# Patient Record
Sex: Female | Born: 1960 | Race: White | Hispanic: No | Marital: Married | Smoking: Never smoker
Health system: Southern US, Community
[De-identification: ages and names within clinical notes are randomized; demographics above are authoritative.]

## PROBLEM LIST (undated history)

## (undated) DIAGNOSIS — F988 Other specified behavioral and emotional disorders with onset usually occurring in childhood and adolescence: Secondary | ICD-10-CM

## (undated) DIAGNOSIS — M810 Age-related osteoporosis without current pathological fracture: Secondary | ICD-10-CM

## (undated) DIAGNOSIS — M419 Scoliosis, unspecified: Secondary | ICD-10-CM

## (undated) DIAGNOSIS — K219 Gastro-esophageal reflux disease without esophagitis: Secondary | ICD-10-CM

## (undated) DIAGNOSIS — I10 Essential (primary) hypertension: Secondary | ICD-10-CM

## (undated) HISTORY — DX: Gastro-esophageal reflux disease without esophagitis: K21.9

## (undated) HISTORY — PX: BUNIONECTOMY: SHX129

## (undated) HISTORY — DX: Other specified behavioral and emotional disorders with onset usually occurring in childhood and adolescence: F98.8

## (undated) HISTORY — DX: Age-related osteoporosis without current pathological fracture: M81.0

## (undated) HISTORY — DX: Scoliosis, unspecified: M41.9

## (undated) HISTORY — PX: BACK SURGERY: SHX140

## (undated) HISTORY — PX: SHOULDER SURGERY: SHX246

## (undated) HISTORY — PX: OTHER SURGICAL HISTORY: SHX169

## (undated) HISTORY — DX: Essential (primary) hypertension: I10

---

## 2004-11-21 ENCOUNTER — Ambulatory Visit (HOSPITAL_COMMUNITY): Admission: RE | Admit: 2004-11-21 | Discharge: 2004-11-21 | Payer: Self-pay | Admitting: Neurosurgery

## 2015-12-25 ENCOUNTER — Other Ambulatory Visit: Payer: Self-pay | Admitting: Orthopedic Surgery

## 2015-12-25 DIAGNOSIS — R911 Solitary pulmonary nodule: Secondary | ICD-10-CM

## 2015-12-28 ENCOUNTER — Ambulatory Visit
Admission: RE | Admit: 2015-12-28 | Discharge: 2015-12-28 | Disposition: A | Discharge status: Home / Self Care | Payer: Self-pay | Source: Ambulatory Visit | Attending: Orthopedic Surgery | Admitting: Orthopedic Surgery

## 2015-12-28 DIAGNOSIS — R911 Solitary pulmonary nodule: Secondary | ICD-10-CM

## 2015-12-28 MED ORDER — IOPAMIDOL (ISOVUE-300) INJECTION 61%
75.0000 mL | Freq: Once | INTRAVENOUS | Status: AC | PRN
  Administered 2015-12-28: 75 mL via INTRAVENOUS

## 2015-12-31 ENCOUNTER — Other Ambulatory Visit: Payer: Self-pay

## 2017-09-02 IMAGING — CT CT CHEST W/ CM
1 series · 15 of 31 positions shown, 19 images · IV contrast (APPLIED)
Comparison: Left shoulder MRI report dated 12/22/2015. Images are
not available for comparison.

CLINICAL DATA: 55-year-old female with 1.6 cm posterior left upper
lung nodule identified on recent outside left shoulder MRI.

EXAM:
CT CHEST WITH CONTRAST
TECHNIQUE: Multidetector CT imaging of the chest was performed during
intravenous contrast administration.
CONTRAST:  75mL 7TUUWO-MRR IOPAMIDOL (7TUUWO-MRR) INJECTION 61%

[Series 2: chest w/cm · axial · 0.77mm/px · z∈[-326,-21]mm · 15 of 67 slices shown, 19 images]
[im 3/67  mediastinal]
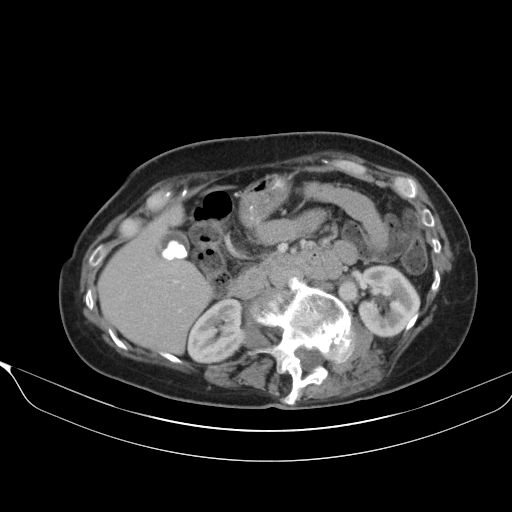
[im 3/67  lung]
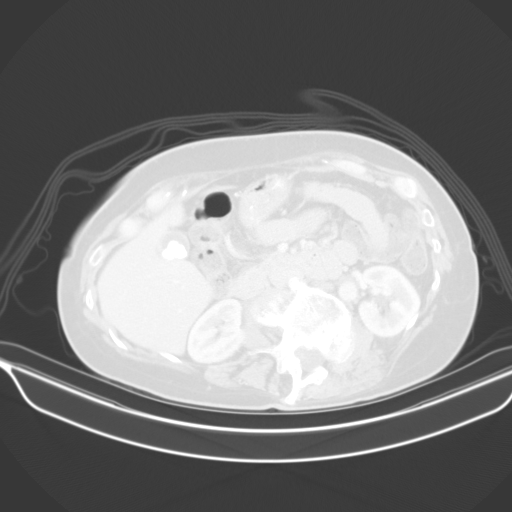
[im 8/67  lung]
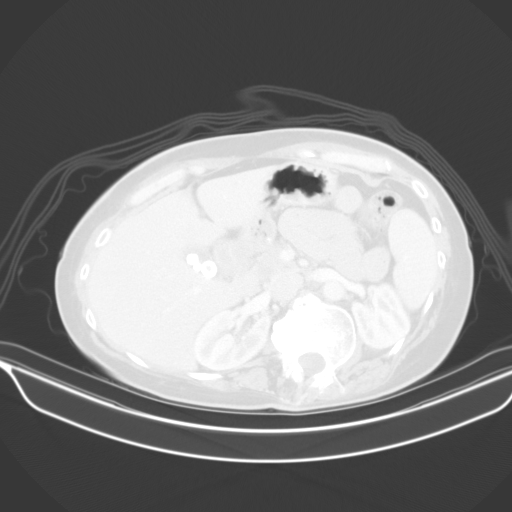
[im 13/67  lung]
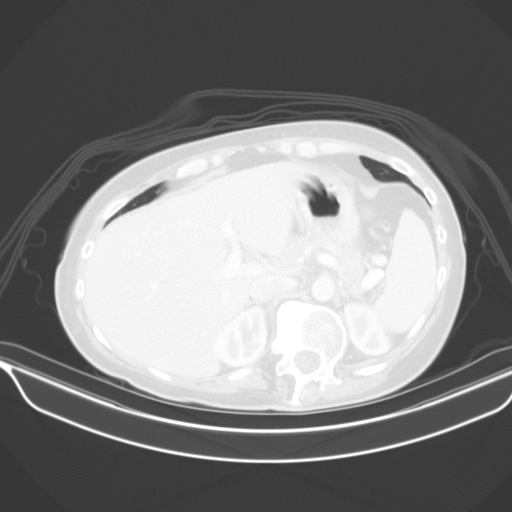
[im 15/67  lung]
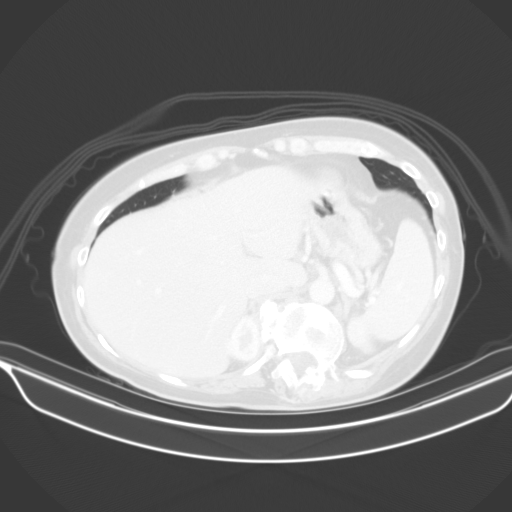
[im 20/67  mediastinal]
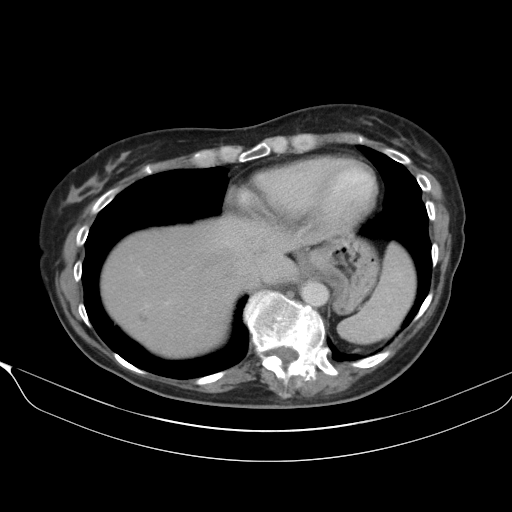
[im 20/67  lung]
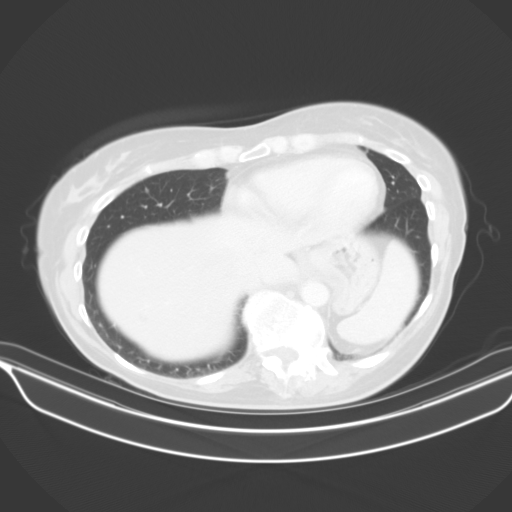
[im 25/67  lung]
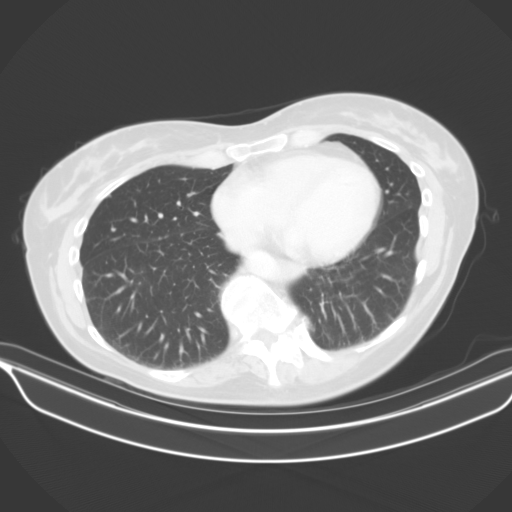
[im 30/67  lung]
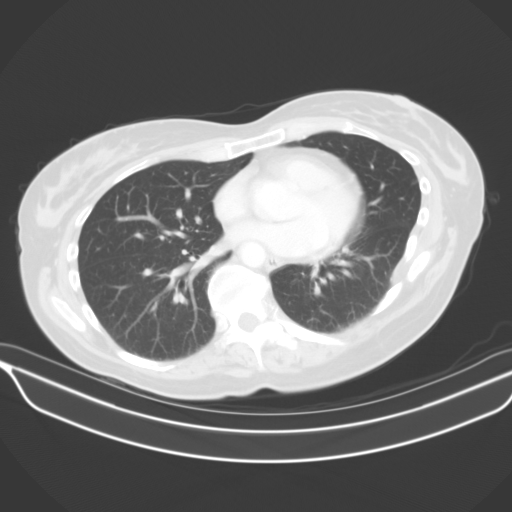
[im 35/67  lung]
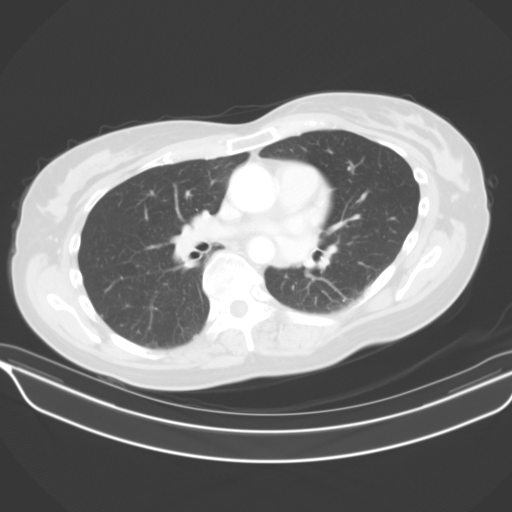
[im 37/67  mediastinal]
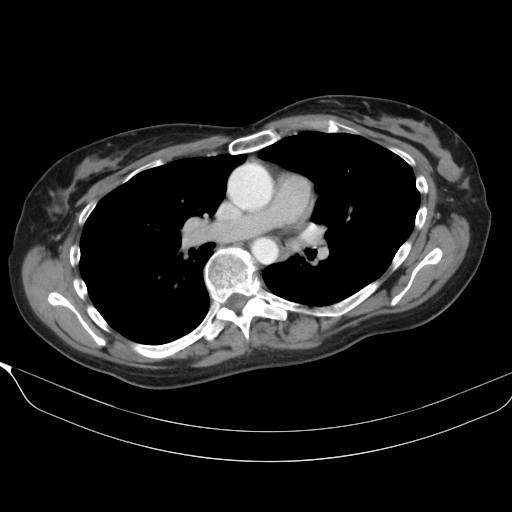
[im 37/67  lung]
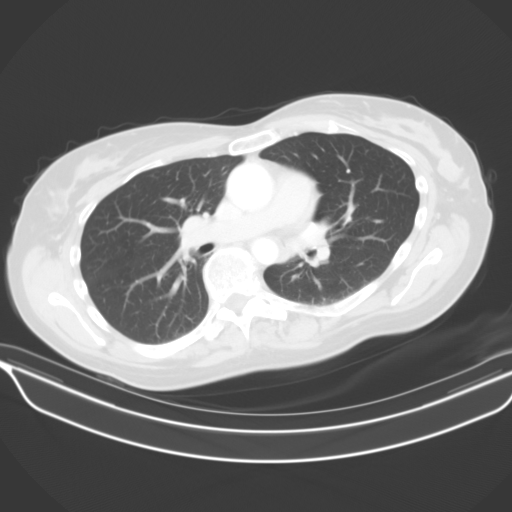
[im 42/67  lung]
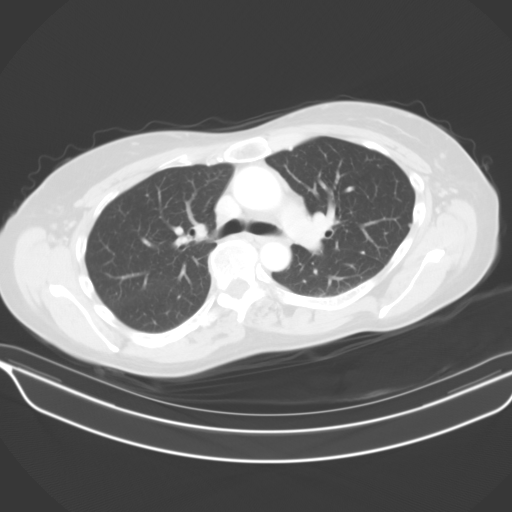
[im 47/67  lung]
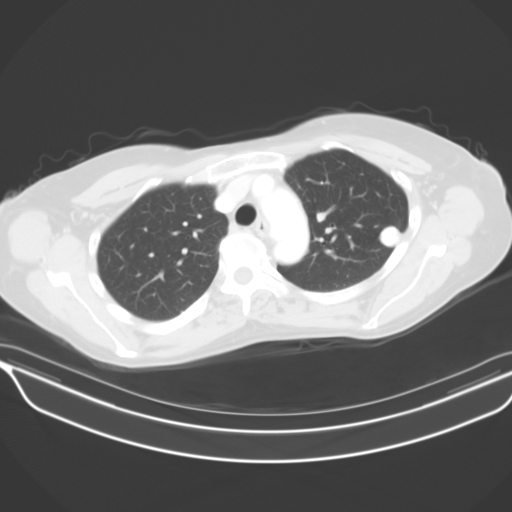
[im 52/67  lung]
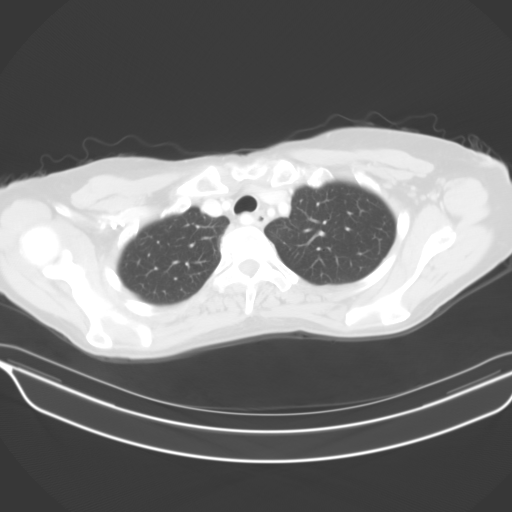
[im 54/67  mediastinal]
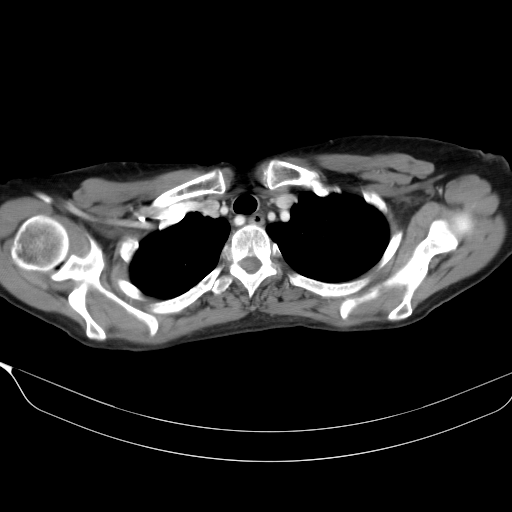
[im 54/67  lung]
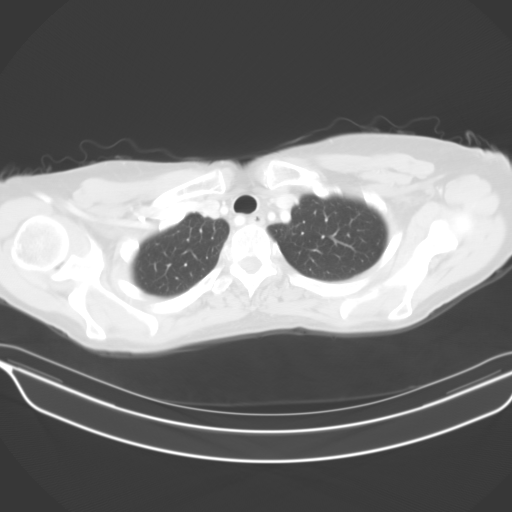
[im 59/67  lung]
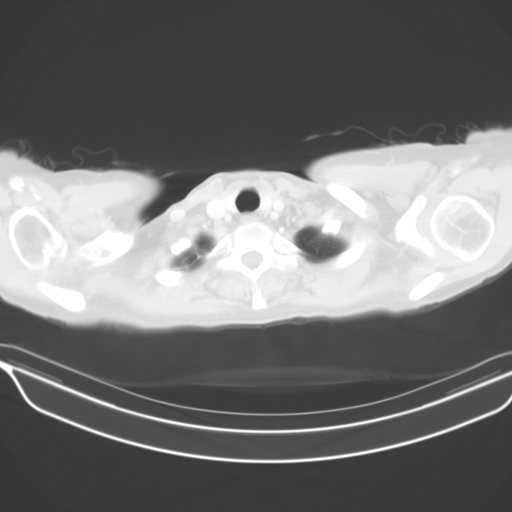
[im 64/67  lung]
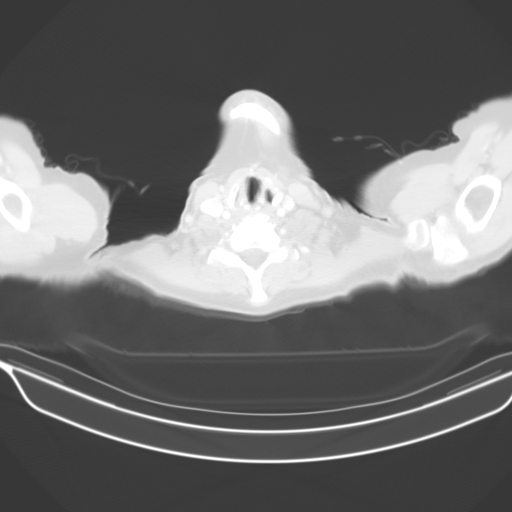

[15 of 31 positions shown; findings below may reference images not displayed]

FINDINGS: Mediastinum/Nodes: An ectatic ascending thoracic aorta is noted
measuring 3.6 cm in diameter. An aberrant right subclavian artery is
present. No mediastinal mass, enlarged lymph nodes or pericardial
effusion identified.

Lungs/Pleura: A 1.6 cm heavily calcified nodule within the posterior
left upper lobe (image 21) is benign. A 7 mm noncalcified subpleural
right lower lobe nodule (image 44) is nonspecific. Other tiny (< 4
mm) subpleural nodules are noted bilaterally. There is no evidence
of pulmonary mass, airspace disease, consolidation or endobronchial/
endotracheal lesion.

Upper abdomen: Cholelithiasis identified.

Musculoskeletal: Severe thoracolumbar scoliosis is noted with
degenerative disc disease and spondylosis.
IMPRESSION: 1.6 cm benign heavily calcified nodule within the posterior left
upper lobe-corresponding to the MR report finding.

Nonspecific but likely benign 7 mm noncalcified subpleural right
lower lobe nodule. Non-contrast chest CT at 6-12 months is
recommended. If the nodule is stable at time of repeat CT, then
future CT at 18-24 months (from today's scan) is considered optional
for low-risk patients, but is recommended for high-risk patients.
This recommendation follows the consensus statement: Guidelines for
Management of Incidental Pulmonary Nodules Detected on CT
Images:From the [HOSPITAL] 2000; published online before
print (10.1148/radiol.4570777775).

Ascending aortic ectasia and aberrant right subclavian artery.

Cholelithiasis.

## 2021-02-11 ENCOUNTER — Encounter: Payer: Self-pay | Admitting: Gastroenterology

## 2021-03-14 ENCOUNTER — Other Ambulatory Visit: Payer: Self-pay

## 2021-03-14 ENCOUNTER — Ambulatory Visit (AMBULATORY_SURGERY_CENTER): Payer: BC Managed Care – PPO

## 2021-03-14 VITALS — Ht 64.0 in | Wt 148.0 lb

## 2021-03-14 DIAGNOSIS — Z1211 Encounter for screening for malignant neoplasm of colon: Secondary | ICD-10-CM

## 2021-03-14 MED ORDER — PLENVU 140 G PO SOLR: 1.0000 | ORAL | 0 refills | Status: DC

## 2021-03-14 NOTE — Progress Notes (Signed)
No allergies to soy or egg Pt is not on blood thinners or diet pills Denies issues with sedation/intubation Denies atrial flutter/fib Denies constipation   Pt is aware of Covid safety and care partner requirements.      

## 2021-03-28 ENCOUNTER — Other Ambulatory Visit: Payer: Self-pay

## 2021-03-28 ENCOUNTER — Ambulatory Visit (AMBULATORY_SURGERY_CENTER): Payer: BC Managed Care – PPO | Admitting: Gastroenterology

## 2021-03-28 ENCOUNTER — Encounter: Payer: Self-pay | Admitting: Gastroenterology

## 2021-03-28 VITALS — BP 106/68 | HR 82 | Temp 98.1°F | Resp 13 | Ht 64.0 in | Wt 148.0 lb

## 2021-03-28 DIAGNOSIS — Z1211 Encounter for screening for malignant neoplasm of colon: Secondary | ICD-10-CM | POA: Diagnosis not present

## 2021-03-28 MED ORDER — SODIUM CHLORIDE 0.9 % IV SOLN: 500.0000 mL | Freq: Once | INTRAVENOUS | Status: DC

## 2021-03-28 NOTE — Op Note (Signed)
[facility] Patient Name: Hannah Craig Procedure Date: 03/28/2021 2:13 PM MRN: [id number] Endoscopist: Lynann Bologna , MD Age: 60 Referring MD:  Date of Birth: 11/30/1960 Gender: Female Account #: [id number] Procedure:                Colonoscopy Indications:              Screening for colorectal malignant neoplasm Medicines:                Monitored Anesthesia Care Procedure:                Pre-Anesthesia Assessment:                           - Prior to the procedure, a History and Physical                            was performed, and patient medications and                            allergies were reviewed. The patient's tolerance of                            previous anesthesia was also reviewed. The risks                            and benefits of the procedure and the sedation                            options and risks were discussed with the patient.                            All questions were answered, and informed consent                            was obtained. Prior Anticoagulants: The patient has                            taken no previous anticoagulant or antiplatelet                            agents. ASA Grade Assessment: II - A patient with                            mild systemic disease. After reviewing the risks                            and benefits, the patient was deemed in                            satisfactory condition to undergo the procedure.                           After obtaining informed consent, the colonoscope  was passed under direct vision. Throughout the                            procedure, the patient's blood pressure, pulse, and                            oxygen saturations were monitored continuously. The                            Olympus [id number]-[id number] (#[id number]) Colonoscope was                            introduced through the anus and advanced to the 2                            cm into the ileum. The  colonoscopy was performed                            without difficulty. The patient tolerated the                            procedure well. The quality of the bowel                            preparation was good. The terminal ileum, ileocecal                            valve, appendiceal orifice, and rectum were                            photographed. Scope In: 2:36:30 PM Scope Out: 2:51:20 PM Scope Withdrawal Time: 0 hours 8 minutes 35 seconds  Total Procedure Duration: 0 hours 14 minutes 50 seconds  Findings:                 A few medium-mouthed diverticula were found in the                            sigmoid colon.                           Non-bleeding internal hemorrhoids were found during                            retroflexion. The hemorrhoids were small.                           The terminal ileum appeared normal.                           The exam was otherwise without abnormality on                            direct and retroflexion views. The colon was highly  redundant. Complications:            No immediate complications. Estimated Blood Loss:     Estimated blood loss: none. Impression:               - Mild sigmoid diverticulosis.                           - Non-bleeding internal hemorrhoids.                           - The examined portion of the ileum was normal.                           - The examination was otherwise normal on direct                            and retroflexion views.                           - No specimens collected. Recommendation:           - Patient has a contact number available for                            emergencies. The signs and symptoms of potential                            delayed complications were discussed with the                            patient. Return to normal activities tomorrow.                            Written discharge instructions were provided to the                            patient.                            - High fiber diet.                           - Continue present medications.                           - Repeat colonoscopy in 10 years for screening                            purposes. Earlier if clinically indicated.                           - The findings and recommendations were discussed                            with the patient's family. Lynann Bologna, MD 03/28/2021 2:54:36 PM This report has been signed electronically.

## 2021-03-28 NOTE — Progress Notes (Signed)
pt tolerated well. VSS. awake and to recovery. Report given to RN.  

## 2021-03-28 NOTE — Patient Instructions (Signed)
Handouts on diverticula, hemorrhoids, and high fiber diet given to you today   YOU HAD AN ENDOSCOPIC PROCEDURE TODAY AT THE [facility]:   Refer to the procedure report that was given to you for any specific questions about what was found during the examination.  If the procedure report does not answer your questions, please call your gastroenterologist to clarify.  If you requested that your care partner not be given the details of your procedure findings, then the procedure report has been included in a sealed envelope for you to review at your convenience later.  YOU SHOULD EXPECT: Some feelings of bloating in the abdomen. Passage of more gas than usual.  Walking can help get rid of the air that was put into your GI tract during the procedure and reduce the bloating. If you had a lower endoscopy (such as a colonoscopy or flexible sigmoidoscopy) you may notice spotting of blood in your stool or on the toilet paper. If you underwent a bowel prep for your procedure, you may not have a normal bowel movement for a few days.  Please Note:  You might notice some irritation and congestion in your nose or some drainage.  This is from the oxygen used during your procedure.  There is no need for concern and it should clear up in a day or so.  SYMPTOMS TO REPORT IMMEDIATELY:  Following lower endoscopy (colonoscopy or flexible sigmoidoscopy):  Excessive amounts of blood in the stool  Significant tenderness or worsening of abdominal pains  Swelling of the abdomen that is new, acute  Fever of 100F or higher  For urgent or emergent issues, a gastroenterologist can be reached at any hour by calling [phone]. Do not use MyChart messaging for urgent concerns.    DIET:  We do recommend a small meal at first, but then you may proceed to your regular diet.  Drink plenty of fluids but you should avoid alcoholic beverages for 24 hours.  ACTIVITY:  You should plan to take it easy for the rest of  today and you should NOT DRIVE or use heavy machinery until tomorrow (because of the sedation medicines used during the test).    FOLLOW UP: Our staff will call the number listed on your records 48-72 hours following your procedure to check on you and address any questions or concerns that you may have regarding the information given to you following your procedure. If we do not reach you, we will leave a message.  We will attempt to reach you two times.  During this call, we will ask if you have developed any symptoms of COVID 19. If you develop any symptoms (ie: fever, flu-like symptoms, shortness of breath, cough etc.) before then, please call [phone].  If you test positive for Covid 19 in the 2 weeks post procedure, please call and report this information to [place].    SIGNATURES/CONFIDENTIALITY: You and/or your care partner have signed paperwork which will be entered into your electronic medical record.  These signatures attest to the fact that that the information above on your After Visit Summary has been reviewed and is understood.  Full responsibility of the confidentiality of this discharge information lies with you and/or your care-partner.

## 2021-03-28 NOTE — Progress Notes (Signed)
Medical history reviewed with no changes noted. VS assessed by C.W 

## 2021-04-02 ENCOUNTER — Telehealth: Payer: Self-pay

## 2021-04-02 NOTE — Telephone Encounter (Signed)
  Follow up Call-  Call back number 03/28/2021  Post procedure Call Back phone  # [phone]  Permission to leave phone message Yes  Some recent data might be hidden     Patient questions:  Do you have a fever, pain , or abdominal swelling? No. Pain Score  0 *  Have you tolerated food without any problems? Yes.    Have you been able to return to your normal activities? Yes.    Do you have any questions about your discharge instructions: Diet   No. Medications  No. Follow up visit  No.  Do you have questions or concerns about your Care? No.  Actions: * If pain score is 4 or above: No action needed, pain <4.  Have you developed a fever since your procedure? no  2.   Have you had an respiratory symptoms (SOB or cough) since your procedure? no  3.   Have you tested positive for COVID 19 since your procedure no  4.   Have you had any family members/close contacts diagnosed with the COVID 19 since your procedure?  no   If yes to any of these questions please route to Laverna Peace, RN and Karlton Lemon, RN

## 2022-04-10 ENCOUNTER — Other Ambulatory Visit: Payer: Self-pay | Admitting: Internal Medicine

## 2022-04-10 DIAGNOSIS — N6489 Other specified disorders of breast: Secondary | ICD-10-CM

## 2022-04-17 ENCOUNTER — Ambulatory Visit
Admission: RE | Admit: 2022-04-17 | Discharge: 2022-04-17 | Disposition: A | Discharge status: Home / Self Care | Payer: BC Managed Care – PPO | Source: Ambulatory Visit | Attending: Internal Medicine | Admitting: Internal Medicine

## 2022-04-17 DIAGNOSIS — N6489 Other specified disorders of breast: Secondary | ICD-10-CM
